# Patient Record
Sex: Male | Born: 1970 | Race: White | Hispanic: No | Marital: Single
Health system: Midwestern US, Community
[De-identification: ages and names within clinical notes are randomized; demographics above are authoritative.]

## PROBLEM LIST (undated history)

## (undated) DIAGNOSIS — I4891 Unspecified atrial fibrillation: Secondary | ICD-10-CM

## (undated) DIAGNOSIS — E119 Type 2 diabetes mellitus without complications: Secondary | ICD-10-CM

---

## 2018-01-09 ENCOUNTER — Emergency Department (HOSPITAL_COMMUNITY): Payer: Commercial Managed Care - PPO

## 2018-01-09 ENCOUNTER — Encounter (HOSPITAL_COMMUNITY): Payer: Self-pay | Admitting: *Deleted

## 2018-01-09 ENCOUNTER — Emergency Department (HOSPITAL_COMMUNITY)
Admission: EM | Admit: 2018-01-09 | Discharge: 2018-01-09 | Disposition: A | Discharge status: Home / Self Care | Payer: Commercial Managed Care - PPO | Attending: Emergency Medicine | Admitting: Emergency Medicine

## 2018-01-09 ENCOUNTER — Other Ambulatory Visit: Payer: Self-pay

## 2018-01-09 DIAGNOSIS — F17200 Nicotine dependence, unspecified, uncomplicated: Secondary | ICD-10-CM | POA: Insufficient documentation

## 2018-01-09 DIAGNOSIS — J449 Chronic obstructive pulmonary disease, unspecified: Secondary | ICD-10-CM | POA: Diagnosis not present

## 2018-01-09 DIAGNOSIS — E119 Type 2 diabetes mellitus without complications: Secondary | ICD-10-CM | POA: Diagnosis not present

## 2018-01-09 DIAGNOSIS — R1010 Upper abdominal pain, unspecified: Secondary | ICD-10-CM | POA: Diagnosis present

## 2018-01-09 DIAGNOSIS — K8500 Idiopathic acute pancreatitis without necrosis or infection: Secondary | ICD-10-CM | POA: Diagnosis not present

## 2018-01-09 HISTORY — DX: Type 2 diabetes mellitus without complications: E11.9

## 2018-01-09 HISTORY — DX: Unspecified atrial fibrillation: I48.91

## 2018-01-09 LAB — COMPREHENSIVE METABOLIC PANEL
ALK PHOS: 79 U/L (ref 38–126)
ALT: 23 U/L (ref 0–44)
ANION GAP: 11 (ref 5–15)
AST: 17 U/L (ref 15–41)
Albumin: 3.5 g/dL (ref 3.5–5.0)
BILIRUBIN TOTAL: 0.5 mg/dL (ref 0.3–1.2)
BUN: 9 mg/dL (ref 6–20)
CALCIUM: 9.1 mg/dL (ref 8.9–10.3)
CO2: 25 mmol/L (ref 22–32)
CREATININE: 0.74 mg/dL (ref 0.61–1.24)
Chloride: 102 mmol/L (ref 98–111)
Glucose, Bld: 228 mg/dL — ABNORMAL HIGH (ref 70–99)
Potassium: 4.1 mmol/L (ref 3.5–5.1)
SODIUM: 138 mmol/L (ref 135–145)
TOTAL PROTEIN: 6.9 g/dL (ref 6.5–8.1)

## 2018-01-09 LAB — URINALYSIS, ROUTINE W REFLEX MICROSCOPIC
BILIRUBIN URINE: NEGATIVE
KETONES UR: 5 mg/dL — AB
LEUKOCYTES UA: NEGATIVE
Nitrite: NEGATIVE
PH: 5 (ref 5.0–8.0)
Protein, ur: 100 mg/dL — AB
SPECIFIC GRAVITY, URINE: 1.02 (ref 1.005–1.030)

## 2018-01-09 LAB — CBC
HCT: 48.4 % (ref 39.0–52.0)
Hemoglobin: 15.2 g/dL (ref 13.0–17.0)
MCH: 28.3 pg (ref 26.0–34.0)
MCHC: 31.4 g/dL (ref 30.0–36.0)
MCV: 90 fL (ref 80.0–100.0)
NRBC: 0 % (ref 0.0–0.2)
PLATELETS: 300 10*3/uL (ref 150–400)
RBC: 5.38 MIL/uL (ref 4.22–5.81)
RDW: 14.1 % (ref 11.5–15.5)
WBC: 13.9 10*3/uL — AB (ref 4.0–10.5)

## 2018-01-09 LAB — LIPASE, BLOOD: Lipase: 72 U/L — ABNORMAL HIGH (ref 11–51)

## 2018-01-09 LAB — TROPONIN I: Troponin I: <0.03 ng/mL (ref <0.03)

## 2018-01-09 MED ORDER — KETOROLAC TROMETHAMINE 10 MG PO TABS: 10.0000 mg | ORAL_TABLET | Freq: Four times a day (QID) | ORAL | 0 refills | Status: AC | PRN

## 2018-01-09 MED ORDER — IPRATROPIUM-ALBUTEROL 0.5-2.5 (3) MG/3ML IN SOLN
3.0000 mL | Freq: Once | RESPIRATORY_TRACT | Status: AC
  Administered 2018-01-09: 3 mL via RESPIRATORY_TRACT
  Filled 2018-01-09: qty 3

## 2018-01-09 MED ORDER — ONDANSETRON 4 MG PO TBDP: 4.0000 mg | ORAL_TABLET | Freq: Four times a day (QID) | ORAL | 0 refills | Status: AC | PRN

## 2018-01-09 MED ORDER — KETOROLAC TROMETHAMINE 30 MG/ML IJ SOLN
30.0000 mg | Freq: Once | INTRAMUSCULAR | Status: AC
  Administered 2018-01-09: 30 mg via INTRAVENOUS
  Filled 2018-01-09: qty 1

## 2018-01-09 MED ORDER — SODIUM CHLORIDE 0.9 % IV BOLUS (SEPSIS)
1000.0000 mL | Freq: Once | INTRAVENOUS | Status: AC
  Administered 2018-01-09: 1000 mL via INTRAVENOUS

## 2018-01-09 MED ORDER — ONDANSETRON HCL 4 MG/2ML IJ SOLN
4.0000 mg | Freq: Once | INTRAMUSCULAR | Status: AC
  Administered 2018-01-09: 4 mg via INTRAVENOUS
  Filled 2018-01-09: qty 2

## 2018-01-09 NOTE — ED Provider Notes (Signed)
TIME SEEN: 5:41 AM  CHIEF COMPLAINT: Upper abdominal pain  HPI: Patient is a 47 year old male with history of atrial fibrillation, COPD, diabetes, previous pancreatitis who presents to the emergency department with upper abdominal pain for 12 hours.  Described as sharp and severe that radiates into his back.  Feels like his previous episodes of pancreatitis.  No history of alcohol use.  He denies chest pain or shortness of breath.  Has been wheezing but states is chronic for him.  Has had nausea but no vomiting or diarrhea.  No bloody stool or melena.  Status post cholecystectomy and appendectomy.  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  Requesting that we avoid narcotics.  ROS: See HPI Constitutional: no fever  Eyes: no drainage  ENT: no runny nose   Cardiovascular:  no chest pain  Resp: no SOB  GI: no vomiting GU: no dysuria Integumentary: no rash  Allergy: no hives  Musculoskeletal: no leg swelling  Neurological: no slurred speech ROS otherwise negative  PAST MEDICAL HISTORY/PAST SURGICAL HISTORY:  Past Medical History:  Diagnosis Date  . Atrial fibrillation (HCC)   . Diabetes mellitus without complication (HCC)     MEDICATIONS:  Prior to Admission medications   Not on File    ALLERGIES:  Not on File  SOCIAL HISTORY:  Social History   Tobacco Use  . Smoking status: Current Every Day Smoker  . Smokeless tobacco: Never Used  Substance Use Topics  . Alcohol use: Never    Frequency: Never    FAMILY HISTORY: No family history on file.  EXAM: BP (!) 180/102 (BP Location: Right Arm)   Pulse 66   Temp 98.1 F (36.7 C) (Oral)   Resp 17   Ht 5\' 10"  (1.778 m)   Wt 90.7 kg   SpO2 95%   BMI 28.70 kg/m  CONSTITUTIONAL: Alert and oriented and responds appropriately to questions.  Appears uncomfortable, obese HEAD: Normocephalic EYES: Conjunctivae clear, pupils appear equal,  EOMI ENT: normal nose; moist mucous membranes NECK: Supple, no meningismus, no nuchal rigidity, no LAD  CARD: RRR; S1 and S2 appreciated; no murmurs, no clicks, no rubs, no gallops RESP: Normal chest excursion without splinting or tachypnea; breath sounds equal bilaterally but he does have scattered expiratory wheezes, no rhonchi or rales, no hypoxia, speaking full sentences ABD/GI: Normal bowel sounds; non-distended; soft, diffusely tender throughout the upper abdomen, no rebound, no guarding, no peritoneal signs, no hepatosplenomegaly BACK:  The back appears normal and is non-tender to palpation, there is no CVA tenderness EXT: Normal ROM in all joints; non-tender to palpation; no edema; normal capillary refill; no cyanosis, no calf tenderness or swelling    SKIN: Normal color for age and race; warm; no rash NEURO: Moves all extremities equally PSYCH: The patient's mood and manner are appropriate. Grooming and personal hygiene are appropriate.  MEDICAL DECISION MAKING: Patient here with upper abdominal pain.  Labs suggest pancreatitis with elevation of his lipase.  Normal LFTs.  Troponin negative.  He denies chest pain or shortness of breath.  Has some wheezing on examination from COPD which she reports is chronic.  Will give DuoNeb.  He is requesting Toradol, Zofran for symptomatic relief.  We will also give IV fluids.  Given his previous history of pancreatitis without complication per his report, I do not feel he needs emergent CT imaging at this time.  He is  comfortable with this plan.  ED PROGRESS: Patient reports feeling much better after Toradol, Zofran and IV fluids.  His oxygen saturation is 97% on room air at rest and his lungs are now clear after breathing treatment.  Patient states that he would like to be discharged.  Will discharge with prescriptions of Toradol, Zofran.  He states he has oxygen in his truck that he can use as needed as well as his nebulizer machine.  I do not feel he  needs steroids at this time.  He only had mild wheezing on examination which she reports is chronic.  I feel he is safe to be discharged.  We discussed return precautions.   At this time, I do not feel there is any life-threatening condition present. I have reviewed and discussed all results (EKG, imaging, lab, urine as appropriate) and exam findings with patient/family. I have reviewed nursing notes and appropriate previous records.  I feel the patient is safe to be discharged home without further emergent workup and can continue workup as an outpatient as needed. Discussed usual and customary return precautions. Patient/family verbalize understanding and are comfortable with this plan.  Outpatient follow-up has been provided if needed. All questions have been answered.       EKG Interpretation  Date/Time:  Thursday January 09 2018 06:19:47 EDT Ventricular Rate:  78 PR Interval:    QRS Duration: 103 QT Interval:  385 QTC Calculation: 439 R Axis:   99 Text Interpretation:  Sinus rhythm Consider right ventricular hypertrophy Minimal ST elevation, inferior leads No old tracing to compare Confirmed by Landon Truax, Baxter Hire [phone]) on 01/09/2018 6:22:31 AM         Shaely Gadberry, Layla Maw, DO 01/09/18 6045

## 2018-01-09 NOTE — ED Triage Notes (Signed)
The pt c/o chest pain through to his back  For 10 hours with abd pain aldo hs of pancreatitis

## 2019-11-23 IMAGING — CR DG CHEST 2V
2 series · 2 of 2 positions shown · non-contrast
Comparison: None.

CLINICAL DATA: Mid chest pain to the back.  Abdominal pain.

EXAM:
CHEST - 2 VIEW

[chest pa]
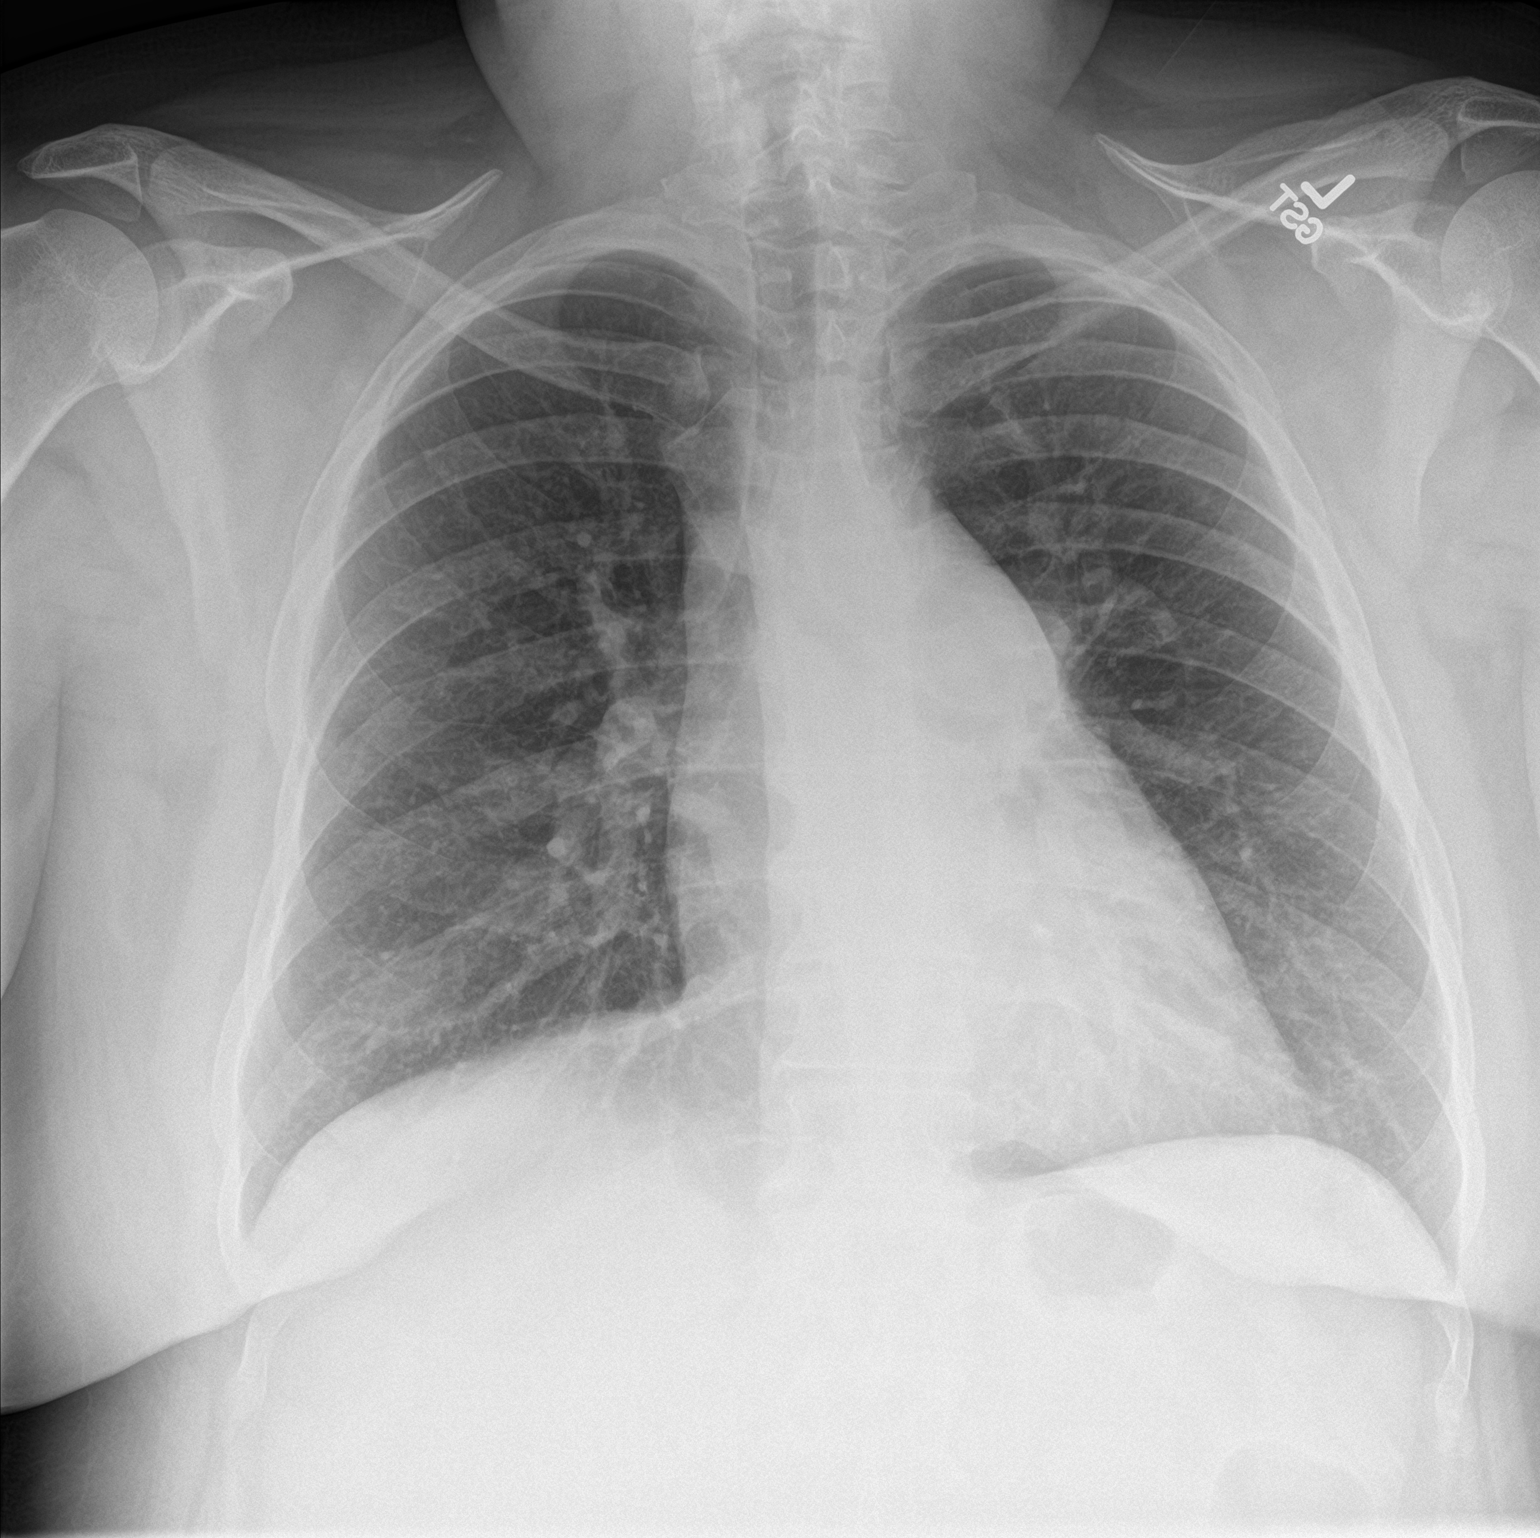

[chest lat]
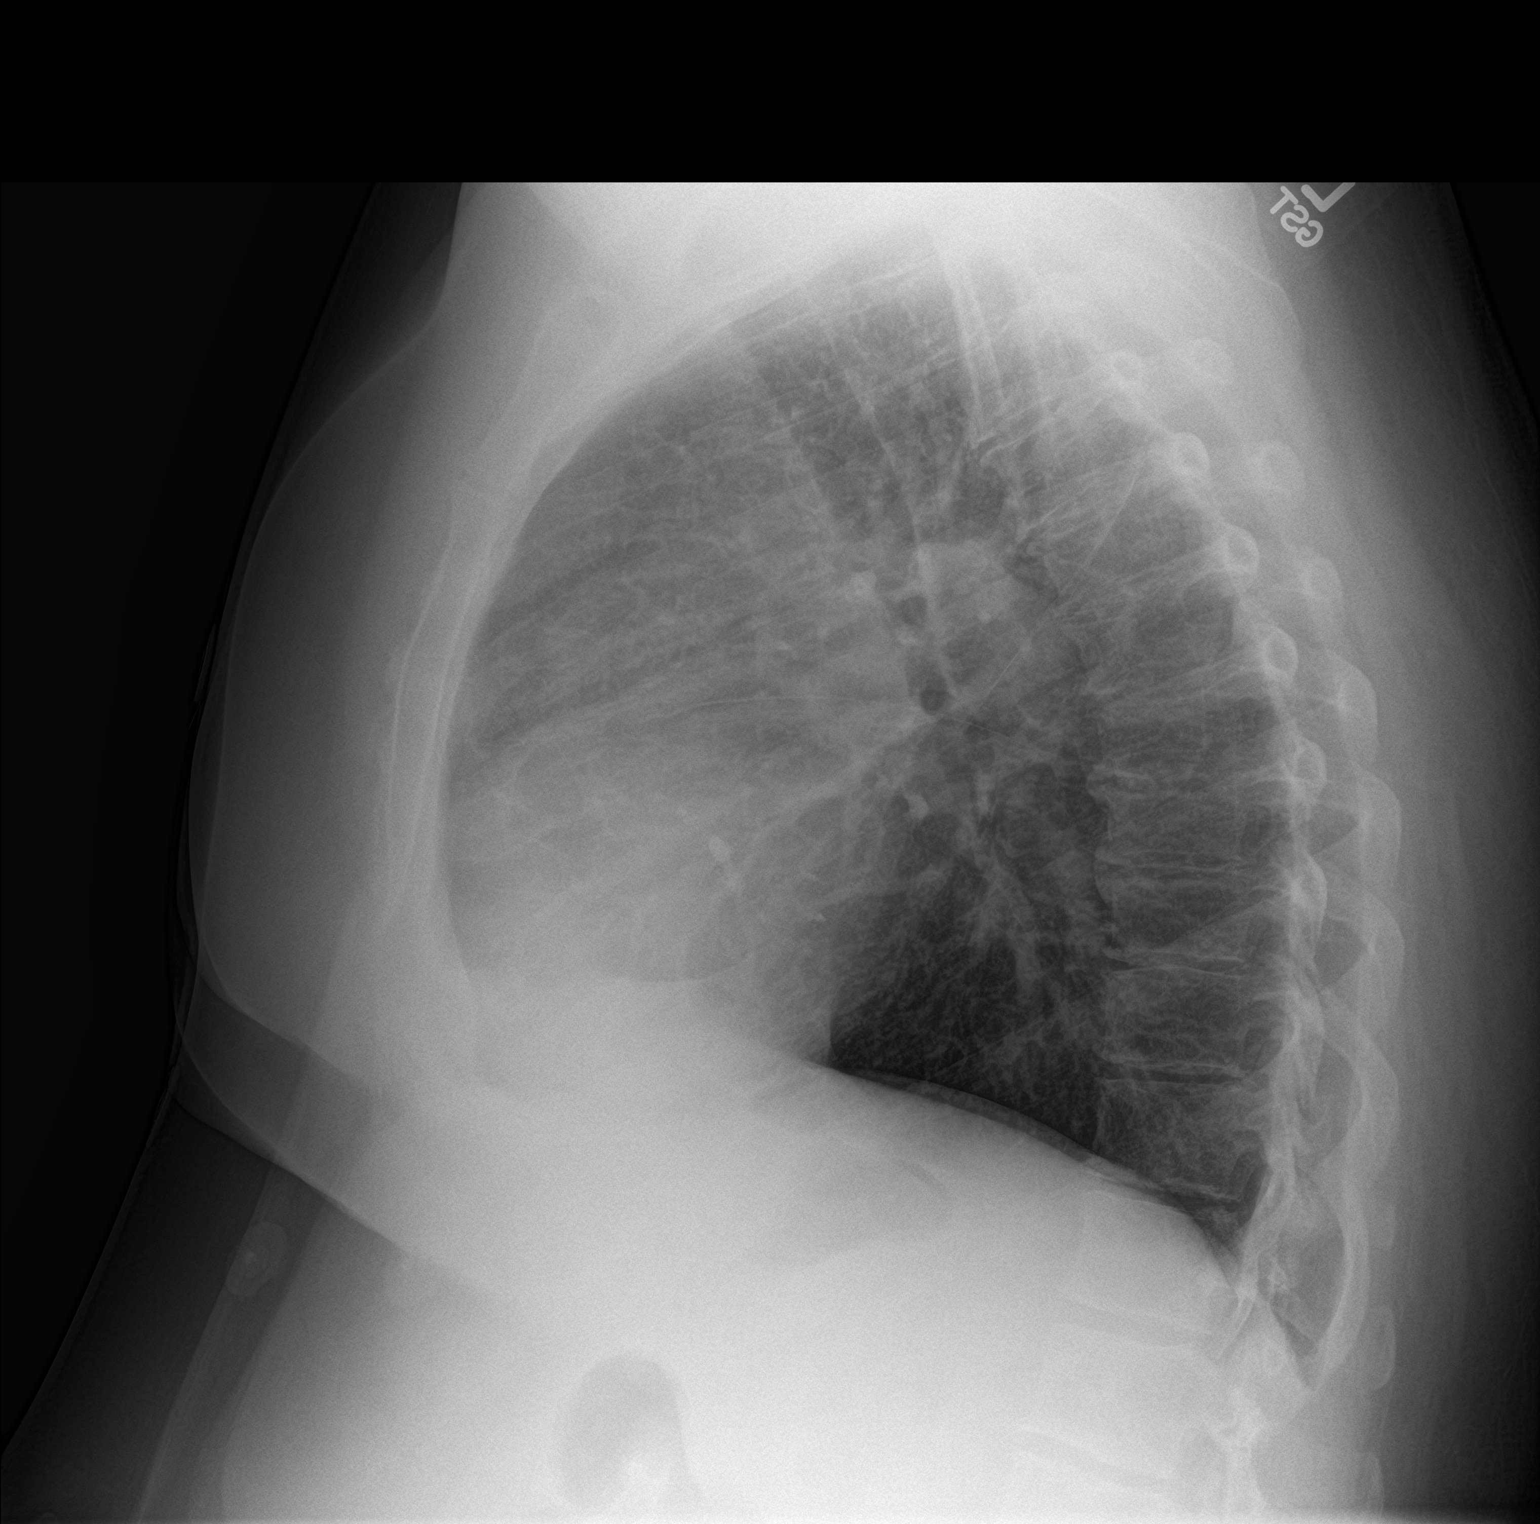

[2 of 2 positions shown; findings below may reference images not displayed]

FINDINGS: Hyperinflation. Normal heart size and pulmonary vascularity. No
focal airspace disease or consolidation in the lungs. No blunting of
costophrenic angles. No pneumothorax. Mediastinal contours appear
intact.
IMPRESSION: No active cardiopulmonary disease.

## 2020-10-20 ENCOUNTER — Emergency Department: Admit: 2020-10-21 | Payer: PRIVATE HEALTH INSURANCE

## 2020-10-20 DIAGNOSIS — R1030 Lower abdominal pain, unspecified: Secondary | ICD-10-CM

## 2020-10-20 NOTE — ED Provider Notes (Signed)
HPI:  10/21/20, Time: 2:58 AM EDT         Troy Duncan is a 50 y.o. male presenting to the ED for abdominal pain, beginning Wednesday night ago.  The complaint has been persistent, moderate in severity, and worsened by nothing.  Patient presenting here because of upper and lower abdominal pain that started Wednesday.  Patient reporting nausea but no vomiting he reports no black or tarry stools reports no fever no chills he reports no chest pain.  Patient reports no new shortness of breath he does have a history of COPD.  Patient reporting no urinary symptoms.  Patient reporting some flank pain.  Patient reporting no productive cough.    ROS:   Pertinent positives and negatives are stated within HPI, all other systems reviewed and are negative.  --------------------------------------------- PAST HISTORY ---------------------------------------------  Past Medical History:  has no past medical history on file.    Past Surgical History:  has no past surgical history on file.    Social History:  reports that he has been smoking cigarettes. He has been smoking an average of 1 pack per day. He has never used smokeless tobacco. He reports that he does not drink alcohol and does not use drugs.    Family History: family history is not on file.     The patient???s home medications have been reviewed.    Allergies: Penicillins and Shellfish allergy    ---------------------------------------------------PHYSICAL EXAM--------------------------------------    Constitutional/General: Alert and oriented x3, well appearing, non toxic in NAD  Head: Normocephalic and atraumatic  Eyes: PERRL, EOMI  Mouth: Oropharynx clear, handling secretions, no trismus  Neck: Supple, full ROM, non tender to palpation in the midline, no stridor, no crepitus, no meningeal signs  Pulmonary: Lungs clear to auscultation bilaterally, no wheezes, rales, or rhonchi. Not in respiratory distress  Cardiovascular:  Regular rate. Regular rhythm. No murmurs,  gallops, or rubs. 2+ distal pulses  Chest: no chest wall tenderness  Abdomen: Soft.  Tender throughout abdomen but mainly upper and lower abdomen non distended.  +BS.  No rebound, guarding, or rigidity. No pulsatile masses appreciated.  Musculoskeletal: Moves all extremities x 4. Warm and well perfused, no clubbing, cyanosis, mild edema lower extremity capillary refill <3 seconds  Skin: warm and dry. No rashes.   Neurologic: GCS 15, CN 2-12 grossly intact, no focal deficits, symmetric strength 5/5 in the upper and lower extremities bilaterally  Psych: Normal Affect    -------------------------------------------------- RESULTS -------------------------------------------------  I have personally reviewed all laboratory and imaging results for this patient. Results are listed below.     LABS:  Results for orders placed or performed during the hospital encounter of 10/21/20   CBC with Auto Differential   Result Value Ref Range    WBC 12.2 (H) 4.5 - 11.5 E9/L    RBC 5.71 3.80 - 5.80 E12/L    Hemoglobin 16.1 12.5 - 16.5 g/dL    Hematocrit 93.2 35.5 - 54.0 %    MCV 86.3 80.0 - 99.9 fL    MCH 28.2 26.0 - 35.0 pg    MCHC 32.7 32.0 - 34.5 %    RDW 13.7 11.5 - 15.0 fL    Platelets 281 130 - 450 E9/L    MPV 9.6 7.0 - 12.0 fL    Neutrophils % 61.5 43.0 - 80.0 %    Immature Granulocytes % 0.5 0.0 - 5.0 %    Lymphocytes % 30.9 20.0 - 42.0 %    Monocytes % 4.9 2.0 -  12.0 %    Eosinophils % 1.6 0.0 - 6.0 %    Basophils % 0.6 0.0 - 2.0 %    Neutrophils Absolute 7.54 (H) 1.80 - 7.30 E9/L    Immature Granulocytes # 0.06 E9/L    Lymphocytes Absolute 3.78 1.50 - 4.00 E9/L    Monocytes Absolute 0.60 0.10 - 0.95 E9/L    Eosinophils Absolute 0.19 0.05 - 0.50 E9/L    Basophils Absolute 0.07 0.00 - 0.20 E9/L   Comprehensive Metabolic Panel w/ Reflex to MG   Result Value Ref Range    Sodium 139 132 - 146 mmol/L    Potassium reflex Magnesium 5.1 (H) 3.5 - 5.0 mmol/L    Chloride 101 98 - 107 mmol/L    CO2 25 22 - 29 mmol/L    Anion Gap 13 7 - 16  mmol/L    Glucose 354 (H) 74 - 99 mg/dL    BUN 7 6 - 20 mg/dL    Creatinine 0.7 0.7 - 1.2 mg/dL    GFR Non-African American >60 >=60 mL/min/1.73    GFR African American >60     Calcium 9.3 8.6 - 10.2 mg/dL    Total Protein 7.6 6.4 - 8.3 g/dL    Albumin 4.0 3.5 - 5.2 g/dL    Total Bilirubin 0.2 0.0 - 1.2 mg/dL    Alkaline Phosphatase 112 40 - 129 U/L    ALT 14 0 - 40 U/L    AST 10 0 - 39 U/L   Lactic Acid   Result Value Ref Range    Lactic Acid 1.4 0.5 - 2.2 mmol/L   Lipase   Result Value Ref Range    Lipase 32 13 - 60 U/L   Urinalysis with Microscopic   Result Value Ref Range    Color, UA Yellow Straw/Yellow    Clarity, UA Clear Clear    Glucose, Ur >=1000 (A) Negative mg/dL    Bilirubin Urine Negative Negative    Ketones, Urine TRACE (A) Negative mg/dL    Specific Gravity, UA 1.015 1.005 - 1.030    Blood, Urine TRACE-INTACT Negative    pH, UA 6.0 5.0 - 9.0    Protein, UA 30 (A) Negative mg/dL    Urobilinogen, Urine 0.2 <2.0 E.U./dL    Nitrite, Urine Negative Negative    Leukocyte Esterase, Urine Negative Negative    WBC, UA NONE 0 - 5 /HPF    RBC, UA 0-1 0 - 2 /HPF    Bacteria, UA NONE SEEN None Seen /HPF   Troponin   Result Value Ref Range    Troponin, High Sensitivity 9 0 - 11 ng/L       RADIOLOGY:  Interpreted by Radiologist.  CT ABDOMEN PELVIS W IV CONTRAST Additional Contrast? None   Final Result   No acute abdominopelvic abnormality.         XR CHEST (2 VW)   Final Result   No acute process.             EKG:  This EKG is signed and interpreted by me.    Rate: 74  Rhythm: Sinus  Interpretation: no acute changes  Comparison: none        ------------------------- NURSING NOTES AND VITALS REVIEWED ---------------------------   The nursing notes within the ED encounter and vital signs as below have been reviewed by myself.  BP (!) 167/98    Pulse 78    Temp 97.9 ??F (36.6 ??C) (Oral)    Resp 19  Ht 5\' 10"  (1.778 m)    Wt 255 lb (115.7 kg)    SpO2 94%    BMI 36.59 kg/m??   Oxygen Saturation Interpretation:  Normal    The patient???s available past medical records and past encounters were reviewed.        ------------------------------ ED COURSE/MEDICAL DECISION MAKING----------------------  Medications   ondansetron (ZOFRAN) injection 4 mg (4 mg IntraVENous Given 10/21/20 0352)   famotidine (PEPCID) 20 mg in sodium chloride (PF) 10 mL injection (20 mg IntraVENous Given 10/21/20 0340)   diphenhydrAMINE (BENADRYL) injection 25 mg (25 mg IntraVENous Given 10/21/20 0340)   methylPREDNISolone sodium (SOLU-MEDROL) injection 125 mg (125 mg IntraVENous Given 10/21/20 0341)   0.9 % sodium chloride bolus (0 mLs IntraVENous Stopped 10/21/20 0427)   dicyclomine (BENTYL) injection 20 mg (20 mg IntraMUSCular Given 10/21/20 0351)   iopamidol (ISOVUE-370) 76 % injection 75 mL (75 mLs IntraVENous Given 10/21/20 0442)             Medical Decision Making:    Patient presenting here because of abdominal pain for the last several days.  Patient reporting no vomiting.  Patient reporting no black or tarry stools.  Patient having no complaints of chest pain or any new shortness of breath.  Patient labs noted reviewed as well as CT.  CT shows no acute findings.  Patient was medicated here with improvement of symptoms.  Patient in no distress on recheck.  Patient comfortable being discharged home is to return if symptoms worsen or persist.    Re-Evaluations:             Patient reevaluated and significantly improved.  Patient feeling much better.  Patient was made aware of findings and plan.    Consultations:                 Critical Care:         This patient's ED course included: a personal history and physicial eaxmination    This patient has remained hemodynamically stable during their ED course.    Counseling:   The emergency provider has spoken with the patient and discussed today???s results, in addition to providing specific details for the plan of care and counseling regarding the diagnosis and prognosis.  Questions are answered at this time and  they are agreeable with the plan.       --------------------------------- IMPRESSION AND DISPOSITION ---------------------------------    IMPRESSION  1. Abdominal pain, unspecified abdominal location        DISPOSITION  Disposition: Discharge to home  Patient condition is stable        NOTE: This report was transcribed using voice recognition software. Every effort was made to ensure accuracy; however, inadvertent computerized transcription errors may be present          10/23/20, MD  10/21/20 [phone]

## 2020-10-20 NOTE — ED Notes (Signed)
Department of Emergency Medicine  FIRST PROVIDER TRIAGE NOTE             Independent MLP           10/20/20  10:16 PM EDT    Date of Encounter: 10/20/20   MRN: [id number]      HPI: Troy Duncan is a 50 y.o. male who presents to the ED for Abdominal Pain ("Upper and lower abdominal pain hat wraps around to the back," started last night. )       ROS: Negative for cp or sob.    PE: Gen Appearance/Constitutional: alert  GI: tender to palpation     Initial Plan of Care: All treatment areas with department are currently occupied. Plan to order/Initiate the following while awaiting opening in ED: labs, EKG, and imaging studies.  Initiate Treatment-Testing, Proceed toTreatment Area When Bed Available for ED Attending/MLP to Continue Care    Electronically signed by Tandy Gaw, PA-C   DD: 10/20/20       Tandy Gaw, PA-C  10/20/20 2217    ATTENDING PROVIDER ATTESTATION:     Supervising Physician, on-site, available for consultation, non-participatory in the evaluation or care of this patient         Dwain Sarna, MD  10/20/20 2337

## 2020-10-20 NOTE — ED Notes (Signed)
Radiology Procedure Waiver   Name: Troy Duncan  DOB: 04/30/1970  MRN: [id number]    Date:  10/21/20    Time: 3:22 AM EDT    Benefits of immediately proceeding with Radiology exam(s) without pre-testing outweigh the risks or are not indicated as specified below and therefore the following is/are being waived:    []  Pregnancy test   []  Patients LMP on-time and regular.   []  Patient had Tubal Ligation or has other Contraception Device.   []  Patient  is Menopausal or Premenarcheal.    []  Patient had Full or Partial Hysterectomy.    [x]  Protocol for Iodine allergy    []  MRI Questionnaire     []  BUN/Creatinine   []  Patient age w/no hx of renal dysfunction.   []  Patient on Dialysis.   []  Recent Normal Labs.  Electronically signed by , MD on 10/21/20 at 3:22 AM EDT               , MD  10/21/20 [phone]

## 2020-10-21 ENCOUNTER — Inpatient Hospital Stay
Admit: 2020-10-21 | Discharge: 2020-10-21 | Disposition: A | Discharge status: Home / Self Care | Payer: PRIVATE HEALTH INSURANCE | Attending: Emergency Medicine

## 2020-10-21 ENCOUNTER — Emergency Department: Admit: 2020-10-21 | Payer: PRIVATE HEALTH INSURANCE

## 2020-10-21 LAB — COMPREHENSIVE METABOLIC PANEL W/ REFLEX TO MG FOR LOW K
ALT: 14 U/L (ref 0–40)
AST: 10 U/L (ref 0–39)
Albumin: 4 g/dL (ref 3.5–5.2)
Alkaline Phosphatase: 112 U/L (ref 40–129)
Anion Gap: 13 mmol/L (ref 7–16)
BUN: 7 mg/dL (ref 6–20)
CO2: 25 mmol/L (ref 22–29)
Calcium: 9.3 mg/dL (ref 8.6–10.2)
Chloride: 101 mmol/L (ref 98–107)
Creatinine: 0.7 mg/dL (ref 0.7–1.2)
GFR African American: >60
GFR Non-African American: >60 mL/min/{1.73_m2} (ref >=60)
Glucose: 354 mg/dL — ABNORMAL HIGH (ref 74–99)
Potassium reflex Magnesium: 5.1 mmol/L — ABNORMAL HIGH (ref 3.5–5.0)
Sodium: 139 mmol/L (ref 132–146)
Total Bilirubin: 0.2 mg/dL (ref 0.0–1.2)
Total Protein: 7.6 g/dL (ref 6.4–8.3)

## 2020-10-21 LAB — EKG 12-LEAD
Atrial Rate: 74 {beats}/min
P Axis: 61 degrees
P-R Interval: 152 ms
Q-T Interval: 390 ms
QRS Duration: 100 ms
QTc Calculation (Bazett): 432 ms
R Axis: 77 degrees
T Axis: 33 degrees
Ventricular Rate: 74 {beats}/min

## 2020-10-21 LAB — CBC WITH AUTO DIFFERENTIAL
Basophils %: 0.6 % (ref 0.0–2.0)
Basophils Absolute: 0.07 E9/L (ref 0.00–0.20)
Eosinophils %: 1.6 % (ref 0.0–6.0)
Eosinophils Absolute: 0.19 E9/L (ref 0.05–0.50)
Hematocrit: 49.3 % (ref 37.0–54.0)
Hemoglobin: 16.1 g/dL (ref 12.5–16.5)
Immature Granulocytes #: 0.06 E9/L
Immature Granulocytes %: 0.5 % (ref 0.0–5.0)
Lymphocytes %: 30.9 % (ref 20.0–42.0)
Lymphocytes Absolute: 3.78 E9/L (ref 1.50–4.00)
MCH: 28.2 pg (ref 26.0–35.0)
MCHC: 32.7 % (ref 32.0–34.5)
MCV: 86.3 fL (ref 80.0–99.9)
MPV: 9.6 fL (ref 7.0–12.0)
Monocytes %: 4.9 % (ref 2.0–12.0)
Monocytes Absolute: 0.6 E9/L (ref 0.10–0.95)
Neutrophils %: 61.5 % (ref 43.0–80.0)
Neutrophils Absolute: 7.54 E9/L — ABNORMAL HIGH (ref 1.80–7.30)
Platelets: 281 E9/L (ref 130–450)
RBC: 5.71 E12/L (ref 3.80–5.80)
RDW: 13.7 fL (ref 11.5–15.0)
WBC: 12.2 E9/L — ABNORMAL HIGH (ref 4.5–11.5)

## 2020-10-21 LAB — URINALYSIS WITH MICROSCOPIC
Bacteria, UA: NONE SEEN /HPF
Bilirubin Urine: NEGATIVE
Glucose, Ur: >=1000 mg/dL — AB
Leukocyte Esterase, Urine: NEGATIVE
Nitrite, Urine: NEGATIVE
Protein, UA: 30 mg/dL — AB
Specific Gravity, UA: 1.015 (ref 1.005–1.030)
Urobilinogen, Urine: 0.2 E.U./dL (ref <2.0)
pH, UA: 6 (ref 5.0–9.0)

## 2020-10-21 LAB — LACTIC ACID: Lactic Acid: 1.4 mmol/L (ref 0.5–2.2)

## 2020-10-21 LAB — LIPASE: Lipase: 32 U/L (ref 13–60)

## 2020-10-21 LAB — TROPONIN: Troponin, High Sensitivity: 9 ng/L (ref 0–11)

## 2020-10-21 MED ORDER — ONDANSETRON HCL 4 MG/2ML IJ SOLN
4 MG/2ML | Freq: Four times a day (QID) | INTRAMUSCULAR | Status: DC | PRN
Start: 2020-10-21 — End: 2020-10-21
  Administered 2020-10-21: 08:00:00 4 mg via INTRAVENOUS

## 2020-10-21 MED ORDER — IOPAMIDOL 76 % IV SOLN
76 % | Freq: Once | INTRAVENOUS | Status: AC | PRN
Start: 2020-10-21 — End: 2020-10-21
  Administered 2020-10-21: 09:00:00 75 mL via INTRAVENOUS

## 2020-10-21 MED ORDER — DIPHENHYDRAMINE HCL 50 MG/ML IJ SOLN
50 MG/ML | Freq: Once | INTRAMUSCULAR | Status: AC
Start: 2020-10-21 — End: 2020-10-21
  Administered 2020-10-21: 08:00:00 25 mg via INTRAVENOUS

## 2020-10-21 MED ORDER — DICYCLOMINE HCL 10 MG PO CAPS
10 MG | ORAL_CAPSULE | Freq: Four times a day (QID) | ORAL | 0 refills | Status: AC
Start: 2020-10-21 — End: 2020-10-26

## 2020-10-21 MED ORDER — DICYCLOMINE HCL 10 MG/ML IM SOLN
10 MG/ML | Freq: Once | INTRAMUSCULAR | Status: AC
Start: 2020-10-21 — End: 2020-10-21
  Administered 2020-10-21: 08:00:00 20 mg via INTRAMUSCULAR

## 2020-10-21 MED ORDER — SODIUM CHLORIDE 0.9 % IV BOLUS
0.9 % | Freq: Once | INTRAVENOUS | Status: AC
Start: 2020-10-21 — End: 2020-10-21
  Administered 2020-10-21: 08:00:00 1000 mL via INTRAVENOUS

## 2020-10-21 MED ORDER — SODIUM CHLORIDE (PF) 0.9 % IJ SOLN
0.9 % | Freq: Once | INTRAMUSCULAR | Status: AC
Start: 2020-10-21 — End: 2020-10-21
  Administered 2020-10-21: 08:00:00 20 mg via INTRAVENOUS

## 2020-10-21 MED ORDER — FAMOTIDINE 20 MG PO TABS
20 MG | ORAL_TABLET | Freq: Two times a day (BID) | ORAL | 0 refills | Status: AC
Start: 2020-10-21 — End: 2020-10-28

## 2020-10-21 MED ORDER — METHYLPREDNISOLONE SODIUM SUCC 125 MG IJ SOLR
125 MG | Freq: Once | INTRAMUSCULAR | Status: AC
Start: 2020-10-21 — End: 2020-10-21
  Administered 2020-10-21: 08:00:00 125 mg via INTRAVENOUS

## 2020-10-21 MED FILL — DICYCLOMINE HCL 10 MG/ML IM SOLN: 10 mg/mL | INTRAMUSCULAR | Qty: 2

## 2020-10-21 MED FILL — ONDANSETRON HCL 4 MG/2ML IJ SOLN: 4 MG/2ML | INTRAMUSCULAR | Qty: 2

## 2020-10-21 MED FILL — FAMOTIDINE (PF) 20 MG/2ML IV SOLN: 20 MG/2ML | INTRAVENOUS | Qty: 2

## 2020-10-21 MED FILL — SOLU-MEDROL 125 MG IJ SOLR: 125 mg | INTRAMUSCULAR | Qty: 125

## 2020-10-21 MED FILL — DIPHENHYDRAMINE HCL 50 MG/ML IJ SOLN: 50 mg/mL | INTRAMUSCULAR | Qty: 1

## 2021-06-27 ENCOUNTER — Other Ambulatory Visit: Payer: Self-pay | Admitting: Physician Assistant

## 2021-06-27 DIAGNOSIS — Z136 Encounter for screening for cardiovascular disorders: Secondary | ICD-10-CM
